# Patient Record
Sex: Male | Born: 1970 | Race: White | Hispanic: No | Smoking: Never smoker
Health system: Southern US, Community
[De-identification: ages and names within clinical notes are randomized; demographics above are authoritative.]

## PROBLEM LIST (undated history)

## (undated) DIAGNOSIS — E119 Type 2 diabetes mellitus without complications: Secondary | ICD-10-CM

## (undated) DIAGNOSIS — I38 Endocarditis, valve unspecified: Secondary | ICD-10-CM

## (undated) DIAGNOSIS — M1990 Unspecified osteoarthritis, unspecified site: Secondary | ICD-10-CM

## (undated) DIAGNOSIS — M109 Gout, unspecified: Secondary | ICD-10-CM

## (undated) DIAGNOSIS — I10 Essential (primary) hypertension: Secondary | ICD-10-CM

---

## 2021-12-23 ENCOUNTER — Emergency Department (HOSPITAL_COMMUNITY): Payer: Self-pay

## 2021-12-23 ENCOUNTER — Encounter (HOSPITAL_COMMUNITY): Payer: Self-pay | Admitting: Emergency Medicine

## 2021-12-23 ENCOUNTER — Emergency Department (HOSPITAL_COMMUNITY)
Admission: EM | Admit: 2021-12-23 | Discharge: 2021-12-23 | Disposition: A | Discharge status: Home / Self Care | Payer: Self-pay | Attending: Emergency Medicine | Admitting: Emergency Medicine

## 2021-12-23 ENCOUNTER — Other Ambulatory Visit: Payer: Self-pay

## 2021-12-23 DIAGNOSIS — M109 Gout, unspecified: Secondary | ICD-10-CM

## 2021-12-23 DIAGNOSIS — E119 Type 2 diabetes mellitus without complications: Secondary | ICD-10-CM | POA: Insufficient documentation

## 2021-12-23 DIAGNOSIS — U071 COVID-19: Secondary | ICD-10-CM | POA: Insufficient documentation

## 2021-12-23 DIAGNOSIS — R000 Tachycardia, unspecified: Secondary | ICD-10-CM | POA: Insufficient documentation

## 2021-12-23 DIAGNOSIS — I10 Essential (primary) hypertension: Secondary | ICD-10-CM | POA: Insufficient documentation

## 2021-12-23 DIAGNOSIS — R079 Chest pain, unspecified: Secondary | ICD-10-CM

## 2021-12-23 DIAGNOSIS — Z7982 Long term (current) use of aspirin: Secondary | ICD-10-CM | POA: Insufficient documentation

## 2021-12-23 HISTORY — DX: Endocarditis, valve unspecified: I38

## 2021-12-23 HISTORY — DX: Essential (primary) hypertension: I10

## 2021-12-23 HISTORY — DX: Type 2 diabetes mellitus without complications: E11.9

## 2021-12-23 HISTORY — DX: Unspecified osteoarthritis, unspecified site: M19.90

## 2021-12-23 HISTORY — DX: Gout, unspecified: M10.9

## 2021-12-23 LAB — CBC
HCT: 46.3 % (ref 39.0–52.0)
Hemoglobin: 16.2 g/dL (ref 13.0–17.0)
MCH: 34.5 pg — ABNORMAL HIGH (ref 26.0–34.0)
MCHC: 35 g/dL (ref 30.0–36.0)
MCV: 98.7 fL (ref 80.0–100.0)
Platelets: 267 10*3/uL (ref 150–400)
RBC: 4.69 MIL/uL (ref 4.22–5.81)
RDW: 12.1 % (ref 11.5–15.5)
WBC: 7.6 10*3/uL (ref 4.0–10.5)
nRBC: 0 % (ref 0.0–0.2)

## 2021-12-23 LAB — HEPATIC FUNCTION PANEL
ALT: 28 U/L (ref 0–44)
AST: 41 U/L (ref 15–41)
Albumin: 3.6 g/dL (ref 3.5–5.0)
Alkaline Phosphatase: 81 U/L (ref 38–126)
Bilirubin, Direct: 0.3 mg/dL — ABNORMAL HIGH (ref 0.0–0.2)
Indirect Bilirubin: 0.9 mg/dL (ref 0.3–0.9)
Total Bilirubin: 1.2 mg/dL (ref 0.3–1.2)
Total Protein: 7.8 g/dL (ref 6.5–8.1)

## 2021-12-23 LAB — ETHANOL: Alcohol, Ethyl (B): <10 mg/dL (ref <10)

## 2021-12-23 LAB — TROPONIN I (HIGH SENSITIVITY)
Troponin I (High Sensitivity): 2 ng/L (ref <18)
Troponin I (High Sensitivity): 3 ng/L (ref <18)

## 2021-12-23 LAB — BRAIN NATRIURETIC PEPTIDE: B Natriuretic Peptide: 18.5 pg/mL (ref 0.0–100.0)

## 2021-12-23 LAB — BASIC METABOLIC PANEL
Anion gap: 11 (ref 5–15)
BUN: 9 mg/dL (ref 6–20)
CO2: 24 mmol/L (ref 22–32)
Calcium: 9.2 mg/dL (ref 8.9–10.3)
Chloride: 100 mmol/L (ref 98–111)
Creatinine, Ser: 1.14 mg/dL (ref 0.61–1.24)
GFR, Estimated: >60 mL/min (ref >60)
Glucose, Bld: 116 mg/dL — ABNORMAL HIGH (ref 70–99)
Potassium: 3.3 mmol/L — ABNORMAL LOW (ref 3.5–5.1)
Sodium: 135 mmol/L (ref 135–145)

## 2021-12-23 LAB — RESP PANEL BY RT-PCR (FLU A&B, COVID) ARPGX2
Influenza A by PCR: NEGATIVE
Influenza B by PCR: NEGATIVE
SARS Coronavirus 2 by RT PCR: POSITIVE — AB

## 2021-12-23 LAB — LIPASE, BLOOD: Lipase: 34 U/L (ref 11–51)

## 2021-12-23 LAB — MAGNESIUM: Magnesium: 1.8 mg/dL (ref 1.7–2.4)

## 2021-12-23 LAB — D-DIMER, QUANTITATIVE: D-Dimer, Quant: 0.8 ug/mL-FEU — ABNORMAL HIGH (ref 0.00–0.50)

## 2021-12-23 MED ORDER — MORPHINE SULFATE (PF) 2 MG/ML IV SOLN
2.0000 mg | Freq: Once | INTRAVENOUS | Status: AC
  Administered 2021-12-23: 2 mg via INTRAVENOUS
  Filled 2021-12-23: qty 1

## 2021-12-23 MED ORDER — ASPIRIN 325 MG PO TABS
325.0000 mg | ORAL_TABLET | Freq: Every day | ORAL | Status: DC
  Administered 2021-12-23: 325 mg via ORAL
  Filled 2021-12-23: qty 1

## 2021-12-23 MED ORDER — ALBUTEROL SULFATE HFA 108 (90 BASE) MCG/ACT IN AERS
2.0000 | INHALATION_SPRAY | RESPIRATORY_TRACT | 1 refills | Status: AC
Start: 2021-12-23 — End: ?

## 2021-12-23 MED ORDER — PANTOPRAZOLE SODIUM 20 MG PO TBEC: 20.0000 mg | DELAYED_RELEASE_TABLET | Freq: Every day | ORAL | 0 refills | Status: AC

## 2021-12-23 MED ORDER — IOHEXOL 350 MG/ML SOLN
100.0000 mL | Freq: Once | INTRAVENOUS | Status: AC | PRN
Start: 2021-12-23 — End: 2021-12-23
  Administered 2021-12-23: 65 mL via INTRAVENOUS

## 2021-12-23 MED ORDER — PANTOPRAZOLE SODIUM 40 MG IV SOLR
40.0000 mg | Freq: Once | INTRAVENOUS | Status: AC
  Administered 2021-12-23: 40 mg via INTRAVENOUS
  Filled 2021-12-23: qty 10

## 2021-12-23 NOTE — ED Notes (Signed)
Lab made aware of add ons

## 2021-12-23 NOTE — ED Provider Notes (Signed)
[facility] EMERGENCY DEPARTMENT Provider Note   CSN: [id number] Arrival date & time: 12/23/21  1303     History  Chief Complaint  Patient presents with   Chest Pain    Nathan Odonnell is a 51 y.o. male reports a history of diabetes, gout, hypertension.  Patient presented today for evaluation of chest pain and shortness of breath that began while he was walking from his vehicle into a store, he reports that pain was sharp central and occasionally would radiate to his left arm, pain somewhat improved with rest.  Patient has received 324 mg aspirin at this point he reports pain is somewhat improved.  He reports that he has been experiencing this pain intermittently for several months, he reports that he is out of all of his medications due to cost.  Patient also reports that has been experiencing chills and tactile fevers for the past week he also endorses a cough with what he believes is small mount of blood in his sputum.  Symptoms are associated with nausea as well as nonbloody/nonbilious emesis x1 over the past day.  Patient denies fall/injury, vision change, neck pain, abdominal pain, extremity swelling/color change, history of blood clot, recent surgery or any additional concerns  HPI     Home Medications Prior to Admission medications   Medication Sig Start Date End Date Taking? Authorizing Provider  albuterol (VENTOLIN HFA) 108 (90 Base) MCG/ACT inhaler Inhale 2 puffs into the lungs See admin instructions. 2 puffs by mouth 3-4 times a day as needed for shortness of breath   Yes [provider]  Naphazoline HCl (CLEAR EYES OP) Apply 2 drops to eye in the morning and at bedtime.   Yes [provider]      Allergies    Patient has no known allergies.    Review of Systems   Review of Systems Ten systems are reviewed and are negative for acute change except as noted in the HPI  Physical Exam Updated Vital Signs BP (!) 131/106   Pulse 84    Temp 98.3 F (36.8 C) (Oral)   Resp 14   SpO2 98%  Physical Exam Constitutional:      General: He is not in acute distress.    Appearance: Normal appearance. He is well-developed. He is not ill-appearing or diaphoretic.  HENT:     Head: Normocephalic and atraumatic.  Eyes:     General: Vision grossly intact. Gaze aligned appropriately.     Pupils: Pupils are equal, round, and reactive to light.  Neck:     Trachea: Trachea and phonation normal.  Cardiovascular:     Rate and Rhythm: Normal rate and regular rhythm.     Heart sounds: Normal heart sounds.  Pulmonary:     Effort: Pulmonary effort is normal. No respiratory distress.     Breath sounds: Transmitted upper airway sounds present.  Abdominal:     General: There is no distension.     Palpations: Abdomen is soft.     Tenderness: There is no abdominal tenderness. There is no guarding or rebound.  Musculoskeletal:        General: Normal range of motion.     Cervical back: Normal range of motion.     Right lower leg: No tenderness. No edema.     Left lower leg: No tenderness. No edema.  Skin:    General: Skin is warm and dry.  Neurological:     Mental Status: He is alert.  GCS: GCS eye subscore is 4. GCS verbal subscore is 5. GCS motor subscore is 6.     Comments: Speech is clear and goal oriented, follows commands Major Cranial nerves without deficit, no facial droop Moves extremities without ataxia, coordination intact  Psychiatric:        Behavior: Behavior normal.     ED Results / Procedures / Treatments   Labs (all labs ordered are listed, but only abnormal results are displayed) Labs Reviewed  BASIC METABOLIC PANEL - Abnormal; Notable for the following components:      Result Value   Potassium 3.3 (*)    Glucose, Bld 116 (*)    All other components within normal limits  CBC - Abnormal; Notable for the following components:   MCH 34.5 (*)    All other components within normal limits  HEPATIC FUNCTION  PANEL - Abnormal; Notable for the following components:   Bilirubin, Direct 0.3 (*)    All other components within normal limits  D-DIMER, QUANTITATIVE (NOT AT [facility]) - Abnormal; Notable for the following components:   D-Dimer, Quant 0.80 (*)    All other components within normal limits  RESP PANEL BY RT-PCR (FLU A&B, COVID) ARPGX2  MAGNESIUM  LIPASE, BLOOD  BRAIN NATRIURETIC PEPTIDE  URINALYSIS, ROUTINE W REFLEX MICROSCOPIC  RAPID URINE DRUG SCREEN, HOSP PERFORMED  ETHANOL  TROPONIN I (HIGH SENSITIVITY)  TROPONIN I (HIGH SENSITIVITY)    EKG EKG Interpretation  Date/Time:  Tuesday December 23 2021 13:26:40 EDT Ventricular Rate:  104 PR Interval:  138 QRS Duration: 90 QT Interval:  324 QTC Calculation: 426 R Axis:   87 Text Interpretation: Sinus tachycardia nonspecific T wave flattening No old tracing to compare Confirmed by Pricilla Loveless [id number]) on 12/23/2021 2:53:46 PM  Radiology DG Chest 1 View  Result Date: 12/23/2021 CLINICAL DATA:  Chest pain. EXAM: CHEST  1 VIEW COMPARISON:  None Available. FINDINGS: The heart size and mediastinal contours are within normal limits. Both lungs are clear. The visualized skeletal structures are unremarkable. IMPRESSION: No active disease. Electronically Signed   By: Lupita Raider M.D.   On: 12/23/2021 13:42    Procedures Procedures    Medications Ordered in ED Medications  aspirin tablet 325 mg (325 mg Oral Given 12/23/21 1356)  pantoprazole (PROTONIX) injection 40 mg (has no administration in time range)    ED Course/ Medical Decision Making/ A&P Clinical Course as of 12/23/21 1545  Tue Dec 23, 2021  1524 CP, exertional dyspnea -- poor hx Maybe going to weeks -- radiating into left arm. Now pending delta Trop, CT angio PE. Previous nuclear stress test, 3x delta troponins. Likely not admit -- probably GERD. [CP]  1526 Troponin I (High Sensitivity) Reassuring in the setting of ongoing pain for several days, lower suspicion for ACS.  [BM]  1526 D-dimer, quantitative(!) 0.80, will need CTA PE study to rule out pulmonary embolism. [BM]  1526 Hepatic function panel(!) LFTs without emergent elevations, low suspicion for biliary obstruction. [BM]  1526 Lipase, blood Lipase within normal limits, doubt pancreatitis [BM]  1527 Magnesium Magnesium within normal limits. [BM]  1527 Brain natriuretic peptide BNP within normal limits. [BM]  1527 CBC(!) CBC without leukocytosis suggest infectious process, no anemia or thrombocytopenia. [BM]  1527 Basic metabolic panel(!) BMP shows mild hypokalemia of 3.3, no emergent Electra derangement, AKI or gap. [BM]  1527 I have personally reviewed patient's 1 view chest x-ray, I do not appreciate any obvious PNA, PTX or acute cardiopulmonary pathology. [BM]  1528  ED EKG I personally reviewed patient's EKG, do not appreciate any obvious acute ischemic changes. [BM]    Clinical Course User Index [BM] Bill Salinas, PA-C [CP] Olene Floss, PA-C                           Medical Decision Making 51 year old male presented with presented for evaluation of exertional chest pain and shortness of breath while walking to the store today.  He reports this has been an ongoing issue for several weeks.  He also reports some nausea vomiting cough and subjective fever.  Differential includes but not limited to ACS, PE, dissection, pneumonia, URI.  Amount and/or Complexity of Data Reviewed External Data Reviewed: notes.    Details: On chart review It appears he was admitted at [facility] on 02/22/2021 for hypomagnesemia, exertional chest pain, syncope and kidney stone disease.  Per review of note it appears that he had a CT PE study which was negative and troponins were negative.  Patient may have had a nuclear stress test which did not show any inducible ischemia.  Appears a TTE was also performed which showed normal EF with no valvular dysfunction.  Unclear if patient had been set up for an  outpatient event monitor. Labs: ordered. Decision-making details documented in ED Course. Radiology: ordered. Decision-making details documented in ED Course. ECG/medicine tests:  Decision-making details documented in ED Course.  Risk Prescription drug management. Risk Details: Currently awaiting CT PE study at this point.  Care handoff given to Reola Mosher PA-C at shift change.  Plan of care is to await results of CT scan along with remainder of labs.  Possible consult to cardiology for recommendations given recurrent exertional chest pain, final disposition per oncoming team.    Note: Portions of this report may have been transcribed using voice recognition software. Every effort was made to ensure accuracy; however, inadvertent computerized transcription errors may still be present.         Final Clinical Impression(s) / ED Diagnoses Final diagnoses:  None    Rx / DC Orders ED Discharge Orders     None         Elizabeth Palau 12/23/21 1545    Pricilla Loveless, MD 12/24/21 1627

## 2021-12-23 NOTE — ED Provider Triage Note (Addendum)
Emergency Medicine Provider Triage Evaluation Note  Nathan Odonnell , a 51 y.o. male  was evaluated in triage.  Pt complains of left-sided chest pain and trouble breathing onset 2-3 days.  Has a history of MI approximately 1.5 years ago treated at [facility].  No stents placed. Has associated vomiting. Hasn't taken his medications in awhile due to not being able to afford them.   Review of Systems  Positive: As per HPI above Negative:   Physical Exam  BP (!) 146/98 (BP Location: Right Arm)   Pulse (!) 103   Temp 98.3 F (36.8 C) (Oral)   Resp 20   SpO2 100%  Gen:   Awake, in acute distress, diaphoretic on exam Resp:  Normal effort MSK:   Moves extremities without difficulty  Other:  Chest wall tenderness to palpation.  Medical Decision Making  Medically screening exam initiated at 1:20 PM.  Appropriate orders placed.  [name] was informed that the remainder of the evaluation will be completed by another provider, this initial triage assessment does not replace that evaluation, and the importance of remaining in the ED until their evaluation is complete.  1:21 PM - Discussed with RN that patient is in need of a room immediately. RN aware and working on room placement.  Work-up initiated   Thomas Mabry A, PA-C 12/23/21 1325    [name] A, PA-C 12/23/21 1325

## 2021-12-23 NOTE — ED Triage Notes (Signed)
Pt arrives via EMS for SOB/cp that started when he was walking to the store. Pt has been out of bp/heart meds for two weeks due to inability pay for them. BP 164/106, HR 104, 99% on room air, CBG 134. Pt complains of headache as well.

## 2021-12-23 NOTE — ED Notes (Signed)
Patient assisted to waiting room via wheelchair to wait and call ride.

## 2021-12-23 NOTE — ED Provider Notes (Addendum)
Accepted handoff at shift change from [facility]. Please see prior provider note for more detail.   Briefly: Patient is 51 y.o.   DDX: concern for chest pain, exertional dyspnea that has been going on for multiple weeks.  Patient with previous work-up, negative nuclear stress test, delta troponin x3, PE score at [facility] a few weeks ago.  Similar symptoms today.  History of alcohol abuse, concern for acid reflux versus ACS versus PE versus other.  Minimally elevated D-dimer, patient pending CT angio at this time.  Plan: I independently interpreted imaging including CT angio PE study which shows no evidence of PE or other intrathoracic abnormality. I agree with the radiologist interpretation.  Independently interpreted lab work including ethanol level which is normal at this time.  Patient with RVP which is negative for COVID, flu.  Discharged per PA Morelli's plan at this time.     RISR  EDTHIS    Olene Floss, PA-C 12/23/21 1728    [place] 12/23/21 1846    Benjiman Core, MD 12/23/21 [phone]

## 2021-12-23 NOTE — ED Notes (Signed)
Pt reports symptoms started x4 days ago and have gotten worse.  Nonproductive cough noted.  Pt reports this has been going on "for a while."  Pt reports he has not had his regular medications x"3 weeks to a month."  Sts he hasn't been able to afford them.

## 2021-12-23 NOTE — Discharge Instructions (Addendum)
Your workup today showed COVID infection. This likely explains some of your chest pain and shob.   In addition, we have some suspicion for acid reflux, Recommend decreased alcohol intake, daily Protonix which was prescribed.  Please follow-up with cardiology, GI.  Return to the emergency department if your symptoms worsen or fail to improve despite treatment.

## 2021-12-23 NOTE — ED Notes (Signed)
Unsuccessful IV attempt x2.  Another RN asked to attempt.   Pt is a poor historian, but seems to have multiple complaints which have been going on for a significant amount of time.

## 2023-07-15 IMAGING — CT CT ANGIO CHEST
2 of 6 series · 18 of 46 positions shown · IV contrast (agent unspecified)
Comparison: Chest radiograph done earlier today

CLINICAL DATA: Chest pain, positive D-dimer

EXAM:
CT ANGIOGRAPHY CHEST WITH CONTRAST
TECHNIQUE: Multidetector CT imaging of the chest was performed using the
standard protocol during bolus administration of intravenous
contrast. Multiplanar CT image reconstructions and MIPs were
obtained to evaluate the vascular anatomy.

[Series 6: thins · axial · 0.80mm/px · z∈[+1087,+1368]mm · 15 of 309 slices shown]
[im 14/309  lung]
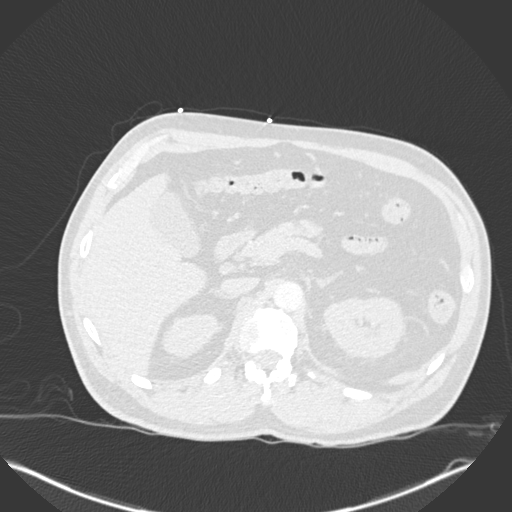
[im 41/309  soft-tissue]
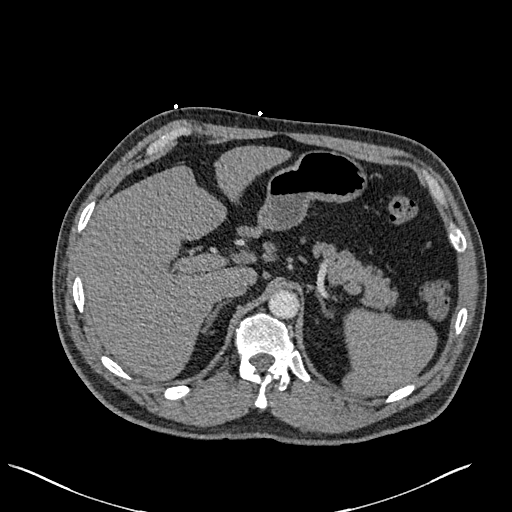
[im 54/309  lung]
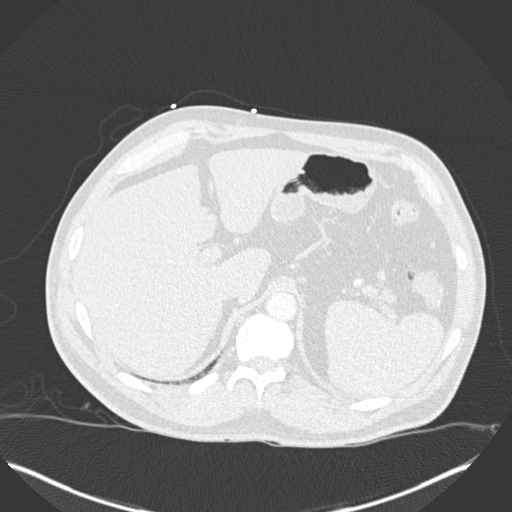
[im 81/309  soft-tissue]
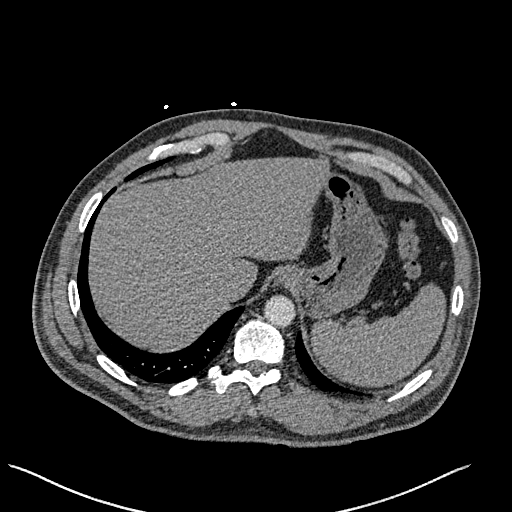
[im 94/309  lung]
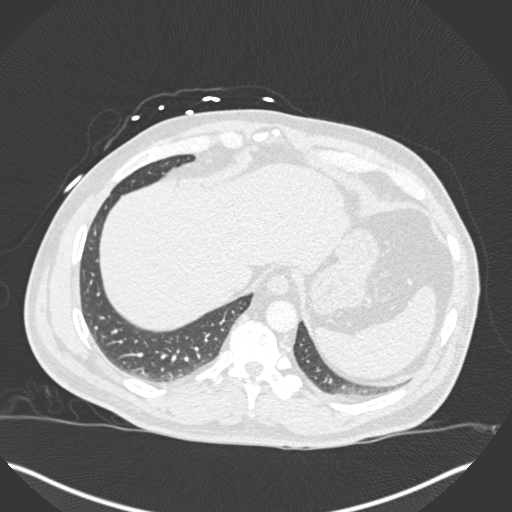
[im 121/309  soft-tissue]
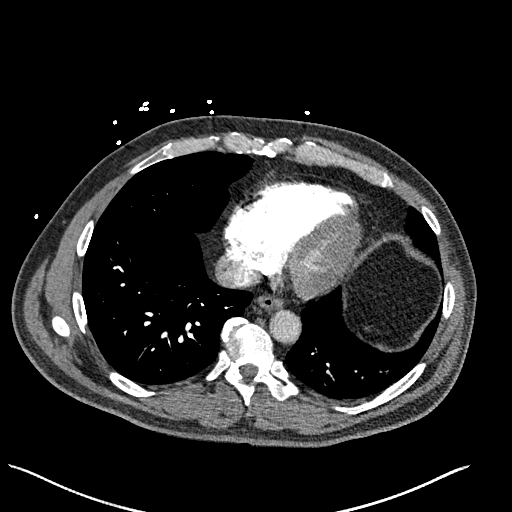
[im 134/309  lung]
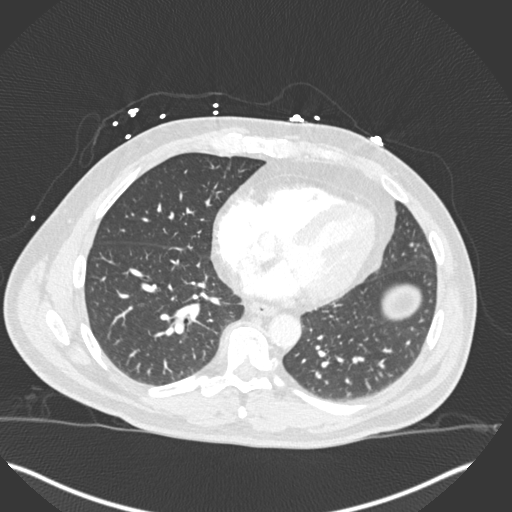
[im 161/309  soft-tissue]
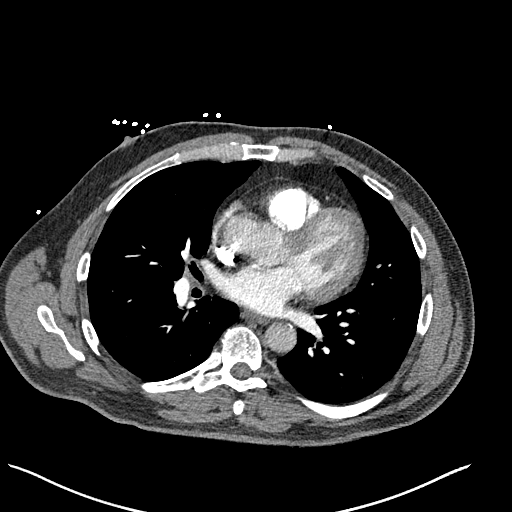
[im 175/309  lung]
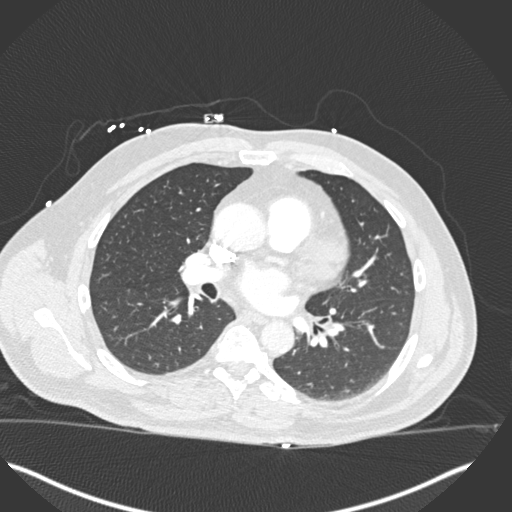
[im 188/309  soft-tissue]
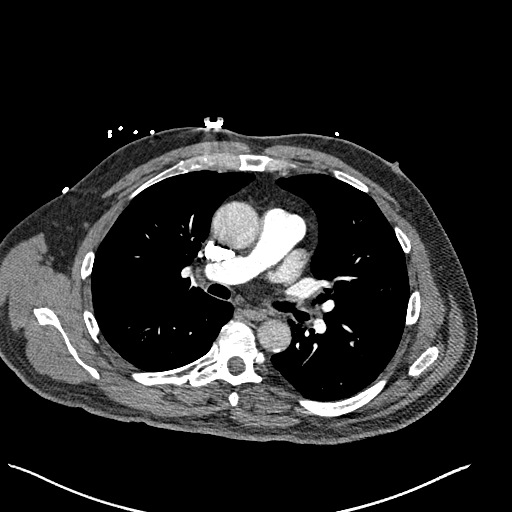
[im 215/309  lung]
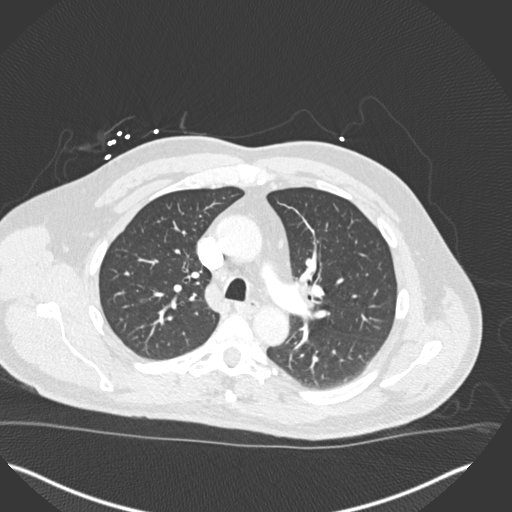
[im 228/309  soft-tissue]
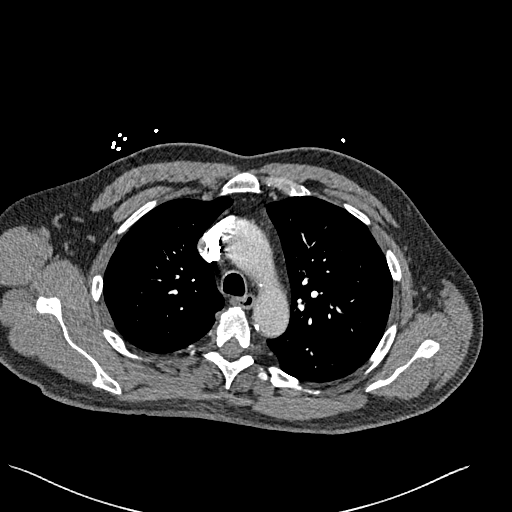
[im 255/309  lung]
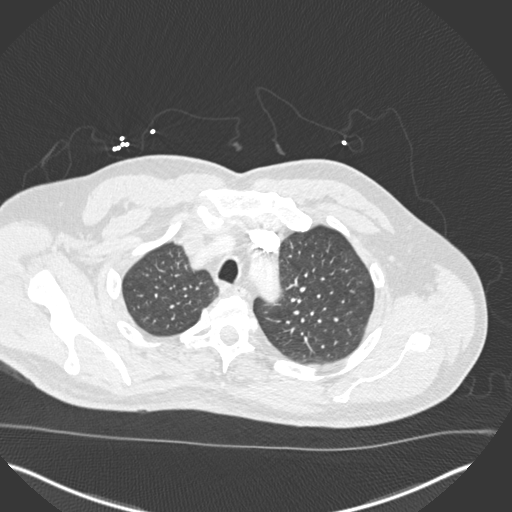
[im 268/309  soft-tissue]
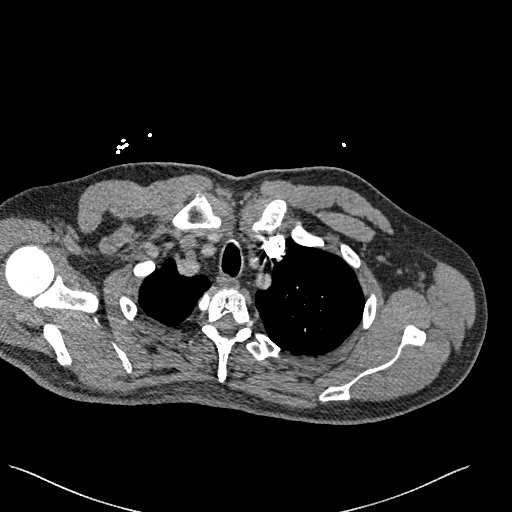
[im 295/309  lung]
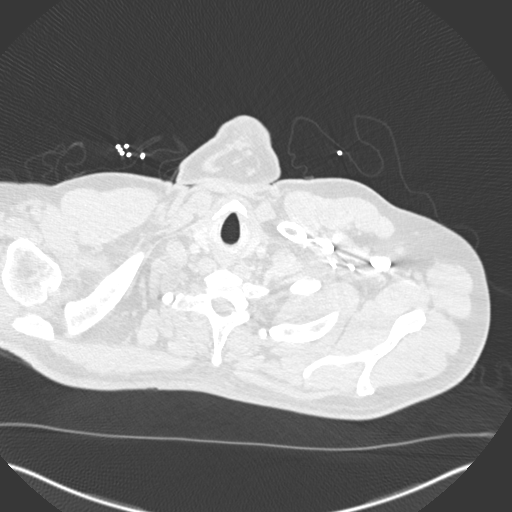

[Series 8: coronal mpr · coronal · 0.61mm/px · 3 of 143 slices shown]
[im 36/143  soft-tissue]
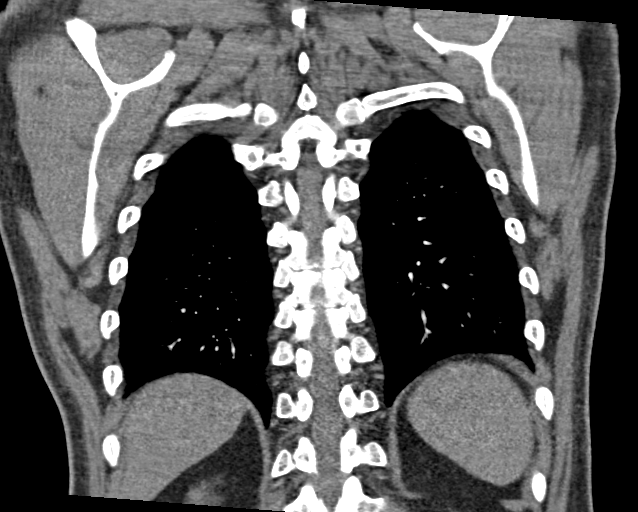
[im 72/143  soft-tissue]
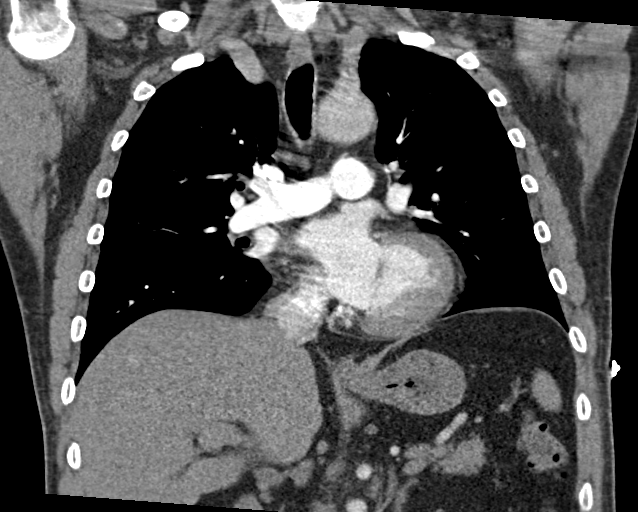
[im 107/143  soft-tissue]
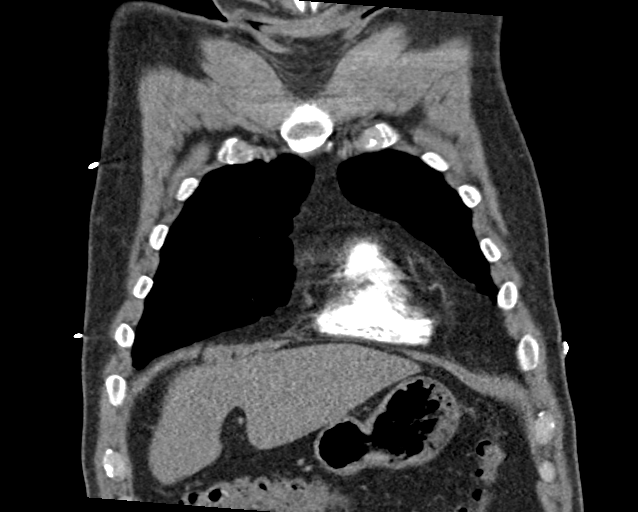

[18 of 46 positions shown; findings below may reference images not displayed]

RADIATION DOSE REDUCTION: This exam was performed according to the
departmental dose-optimization program which includes automated
exposure control, adjustment of the mA and/or kV according to
patient size and/or use of iterative reconstruction technique.

CONTRAST:  65mL OMNIPAQUE IOHEXOL 350 MG/ML SOLN
FINDINGS: Cardiovascular: There is homogeneous enhancement in thoracic aorta.
No intraluminal filling defects are seen in the central pulmonary
artery branches. Evaluation of small peripheral branches in the left
lower lung fields is limited by motion.

Mediastinum/Nodes: No significant lymphadenopathy seen.

Lungs/Pleura: There is no focal pulmonary consolidation. There is no
pleural effusion or pneumothorax.

Upper Abdomen: Unremarkable.

Musculoskeletal: Unremarkable.

Review of the MIP images confirms the above findings.
IMPRESSION: There is no evidence of pulmonary artery embolism. There is no
evidence of thoracic aortic dissection. There is no focal pulmonary
consolidation. There is no pleural effusion or pneumothorax.

## 2023-07-15 IMAGING — CR DG CHEST 1V
1 series · 1 of 1 positions shown · non-contrast
Comparison: None Available.

CLINICAL DATA: Chest pain.

EXAM:
CHEST  1 VIEW

[chest ap]
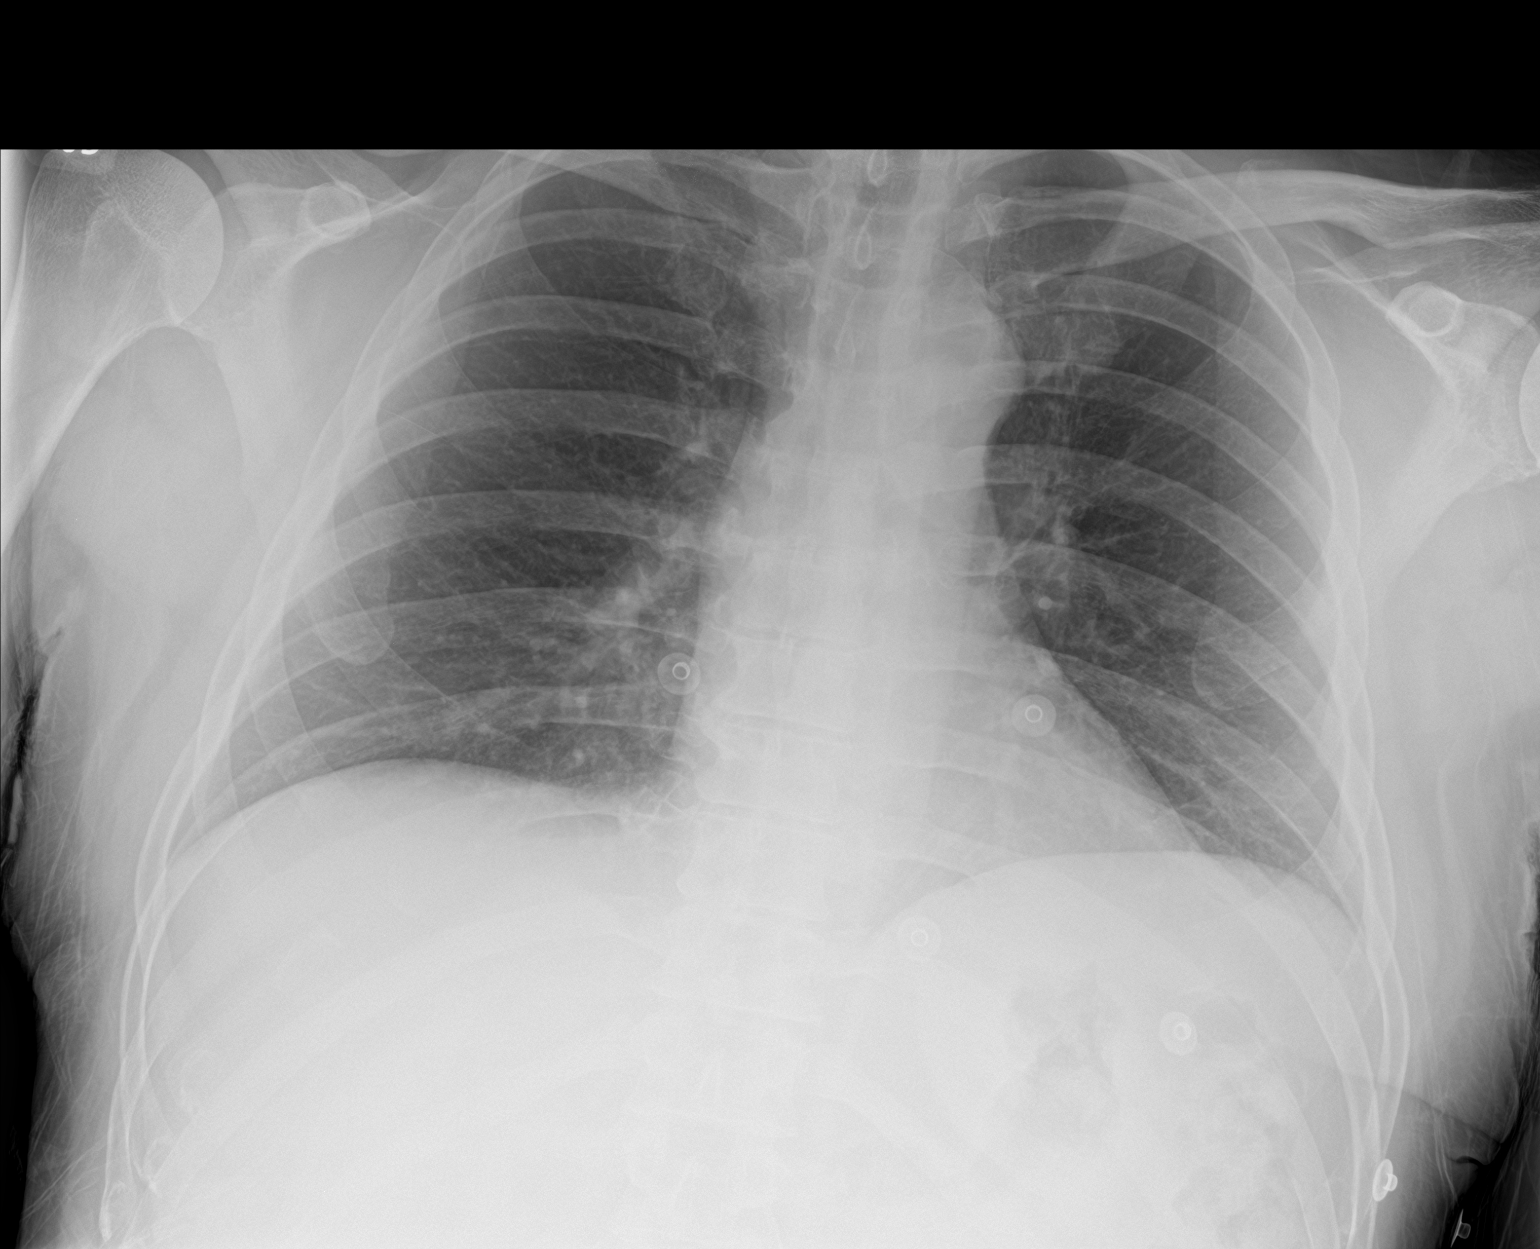

[1 of 1 positions shown; findings below may reference images not displayed]

FINDINGS: The heart size and mediastinal contours are within normal limits.
Both lungs are clear. The visualized skeletal structures are
unremarkable.
IMPRESSION: No active disease.
